# Patient Record
Sex: Male | Born: 1975 | Race: Black or African American | Hispanic: No | Marital: Married | Smoking: Current every day smoker
Health system: Southern US, Community
[De-identification: ages and names within clinical notes are randomized; demographics above are authoritative.]

## PROBLEM LIST (undated history)

## (undated) DIAGNOSIS — N200 Calculus of kidney: Secondary | ICD-10-CM

---

## 2003-04-26 ENCOUNTER — Emergency Department (HOSPITAL_COMMUNITY): Admission: EM | Admit: 2003-04-26 | Discharge: 2003-04-26 | Payer: Self-pay | Admitting: Emergency Medicine

## 2013-08-22 ENCOUNTER — Emergency Department (HOSPITAL_COMMUNITY)
Admission: EM | Admit: 2013-08-22 | Discharge: 2013-08-22 | Disposition: A | Discharge status: Home / Self Care | Payer: Self-pay | Attending: Emergency Medicine | Admitting: Emergency Medicine

## 2013-08-22 ENCOUNTER — Emergency Department (HOSPITAL_COMMUNITY): Payer: Self-pay

## 2013-08-22 ENCOUNTER — Encounter (HOSPITAL_COMMUNITY): Payer: Self-pay | Admitting: Emergency Medicine

## 2013-08-22 DIAGNOSIS — N23 Unspecified renal colic: Secondary | ICD-10-CM

## 2013-08-22 DIAGNOSIS — F17200 Nicotine dependence, unspecified, uncomplicated: Secondary | ICD-10-CM | POA: Insufficient documentation

## 2013-08-22 DIAGNOSIS — N201 Calculus of ureter: Secondary | ICD-10-CM | POA: Insufficient documentation

## 2013-08-22 DIAGNOSIS — R110 Nausea: Secondary | ICD-10-CM | POA: Insufficient documentation

## 2013-08-22 LAB — CBC WITH DIFFERENTIAL/PLATELET
Basophils Absolute: 0 10*3/uL (ref 0.0–0.1)
Basophils Relative: 0 % (ref 0–1)
Eosinophils Absolute: 0.1 10*3/uL (ref 0.0–0.7)
Eosinophils Relative: 1 % (ref 0–5)
HCT: 41.8 % (ref 39.0–52.0)
Hemoglobin: 15.4 g/dL (ref 13.0–17.0)
Lymphocytes Relative: 28 % (ref 12–46)
Lymphs Abs: 3.9 10*3/uL (ref 0.7–4.0)
MCH: 32.4 pg (ref 26.0–34.0)
MCHC: 36.8 g/dL — ABNORMAL HIGH (ref 30.0–36.0)
MCV: 88 fL (ref 78.0–100.0)
Monocytes Absolute: 1 10*3/uL (ref 0.1–1.0)
Monocytes Relative: 7 % (ref 3–12)
Neutro Abs: 8.9 10*3/uL — ABNORMAL HIGH (ref 1.7–7.7)
Neutrophils Relative %: 64 % (ref 43–77)
Platelets: 246 10*3/uL (ref 150–400)
RBC: 4.75 MIL/uL (ref 4.22–5.81)
RDW: 12.1 % (ref 11.5–15.5)
WBC: 13.9 10*3/uL — ABNORMAL HIGH (ref 4.0–10.5)

## 2013-08-22 LAB — BASIC METABOLIC PANEL
BUN: 14 mg/dL (ref 6–23)
CO2: 22 mEq/L (ref 19–32)
Calcium: 9.5 mg/dL (ref 8.4–10.5)
Chloride: 101 mEq/L (ref 96–112)
Creatinine, Ser: 1.26 mg/dL (ref 0.50–1.35)
GFR calc Af Amer: 82 mL/min — ABNORMAL LOW (ref >90)
GFR calc non Af Amer: 70 mL/min — ABNORMAL LOW (ref >90)
Glucose, Bld: 167 mg/dL — ABNORMAL HIGH (ref 70–99)
Potassium: 3.5 mEq/L (ref 3.5–5.1)
Sodium: 136 mEq/L (ref 135–145)

## 2013-08-22 MED ORDER — IBUPROFEN 600 MG PO TABS: 600.0000 mg | ORAL_TABLET | Freq: Four times a day (QID) | ORAL | Status: DC | PRN

## 2013-08-22 MED ORDER — ONDANSETRON HCL 4 MG/2ML IJ SOLN
4.0000 mg | Freq: Once | INTRAMUSCULAR | Status: AC
  Administered 2013-08-22: 4 mg via INTRAVENOUS
  Filled 2013-08-22: qty 2

## 2013-08-22 MED ORDER — SODIUM CHLORIDE 0.9 % IV BOLUS (SEPSIS)
1000.0000 mL | Freq: Once | INTRAVENOUS | Status: AC
  Administered 2013-08-22: 1000 mL via INTRAVENOUS

## 2013-08-22 MED ORDER — OXYCODONE-ACETAMINOPHEN 5-325 MG PO TABS: 1.0000 | ORAL_TABLET | ORAL | Status: DC | PRN

## 2013-08-22 MED ORDER — ONDANSETRON HCL 4 MG PO TABS: 4.0000 mg | ORAL_TABLET | Freq: Four times a day (QID) | ORAL | Status: DC

## 2013-08-22 MED ORDER — HYDROMORPHONE HCL PF 1 MG/ML IJ SOLN
1.0000 mg | Freq: Once | INTRAMUSCULAR | Status: AC
  Administered 2013-08-22: 1 mg via INTRAVENOUS
  Filled 2013-08-22: qty 1

## 2013-08-22 MED ORDER — KETOROLAC TROMETHAMINE 30 MG/ML IJ SOLN
30.0000 mg | Freq: Once | INTRAMUSCULAR | Status: AC
  Administered 2013-08-22: 30 mg via INTRAVENOUS
  Filled 2013-08-22: qty 1

## 2013-08-22 NOTE — ED Notes (Signed)
Pt c/o RLQ pain sudden onset tonight @ 2330, pain radiating from R testicle to lower abdomen. + nausea

## 2013-08-22 NOTE — ED Notes (Signed)
Pt sts that he is not void at this time

## 2013-08-27 NOTE — ED Provider Notes (Signed)
CSN: [id number]     Arrival date & time 08/22/13  0033 History   First MD Initiated Contact with Patient 08/22/13 0043     Chief Complaint  Patient presents with  . Abdominal Pain   (Consider location/radiation/quality/duration/timing/severity/associated sxs/prior Treatment) HPI  37 year old male with abdominal pain. Right lower quadrant to right flank. Acute onset around 11:30 PM tonight. Radiates down to his right testicle. Denies trauma. Feels nauseated, but no vomiting. No urinary complaints. No fevers or chills. No history similar complaints. No diarrhea. No intervention prior to arrival. No past history of similar symptoms. History reviewed. No pertinent past medical history. History reviewed. No pertinent past surgical history. No family history on file. History  Substance Use Topics  . Smoking status: Current Every Day Smoker  . Smokeless tobacco: Not on file  . Alcohol Use: No    Review of Systems  All systems reviewed and negative, other than as noted in HPI.   Allergies  Review of patient's allergies indicates no known allergies.  Home Medications   Current Outpatient Rx  Name  Route  Sig  Dispense  Refill  . ibuprofen (ADVIL,MOTRIN) 600 MG tablet   Oral   Take 1 tablet (600 mg total) by mouth every 6 (six) hours as needed for pain.   30 tablet   0   . ondansetron (ZOFRAN) 4 MG tablet   Oral   Take 1 tablet (4 mg total) by mouth every 6 (six) hours.   12 tablet   0   . oxyCODONE-acetaminophen (PERCOCET/ROXICET) 5-325 MG per tablet   Oral   Take 1-2 tablets by mouth every 4 (four) hours as needed for pain.   20 tablet   0    BP 140/66  Pulse 65  Temp(Src) 97.7 F (36.5 C) (Oral)  Resp 14  Ht 5\' 11"  (1.803 m)  Wt 205 lb (92.987 kg)  BMI 28.6 kg/m2  SpO2 98% Physical Exam  Nursing note and vitals reviewed. Constitutional: He appears well-developed and well-nourished. No distress.  HENT:  Head: Normocephalic and atraumatic.  Eyes: Conjunctivae  are normal. Right eye exhibits no discharge. Left eye exhibits no discharge.  Neck: Neck supple.  Cardiovascular: Normal rate, regular rhythm and normal heart sounds.  Exam reveals no gallop and no friction rub.   No murmur heard. Pulmonary/Chest: Effort normal and breath sounds normal. No respiratory distress.  Abdominal: Soft. He exhibits no distension. There is no tenderness.  Genitourinary:  No CVA tenderness. Normal external male genitalia. No swelling. No tenderness. No guarding appreciated.  Musculoskeletal: He exhibits no edema and no tenderness.  Neurological: He is alert.  Skin: Skin is warm and dry.  Psychiatric: He has a normal mood and affect. His behavior is normal. Thought content normal.    ED Course  Procedures (including critical care time) Labs Review Labs Reviewed  CBC WITH DIFFERENTIAL - Abnormal; Notable for the following:    WBC 13.9 (*)    MCHC 36.8 (*)    Neutro Abs 8.9 (*)    All other components within normal limits  BASIC METABOLIC PANEL - Abnormal; Notable for the following:    Glucose, Bld 167 (*)    GFR calc non Af Amer 70 (*)    GFR calc Af Amer 82 (*)    All other components within normal limits   Imaging Review No results found.  Ct Abdomen Pelvis Wo Contrast  08/22/2013   CLINICAL DATA:  Right flank pain.  EXAM: CT ABDOMEN AND PELVIS WITHOUT  CONTRAST  TECHNIQUE: Multidetector CT imaging of the abdomen and pelvis was performed following the standard protocol without intravenous contrast.  COMPARISON:  None.  FINDINGS: BODY WALL: Unremarkable.  LOWER CHEST:  Mediastinum: Unremarkable.  Lungs/pleura: No consolidation.  ABDOMEN/PELVIS:  Liver: No focal abnormality.  Biliary: No evidence of biliary obstruction or stone.  Pancreas: Unremarkable.  Spleen: Unremarkable.  Adrenals: Unremarkable.  Kidneys and ureters: Mild right hydroureteronephrosis secondary to a distal right ureteral calculus measuring 5 mm. There are additional right renal calculi,  measuring up to 4 mm. No left-sided hydronephrosis or urolithiasis.  Bladder: Unremarkable.  Bowel: No obstruction. Normal appendix.  Retroperitoneum: No mass or adenopathy.  Peritoneum: No free fluid or gas.  Reproductive: Unremarkable.  Vascular: No acute abnormality.  OSSEOUS: No acute abnormalities. No suspicious lytic or blastic lesions.  IMPRESSION: 1. 5 mm distal right ureteral calculus with proximal obstruction. 2. Right nephrolithiasis, up to 4 mm diameter.   Electronically Signed   By: Tiburcio Pea   On: 08/22/2013 01:44   MDM   1. Ureteral colic   2. Right ureteral stone    37 year old male with right-sided ureteral stone. Renal function is fine. Symptoms are controlled. Afebrile. No urinary complaints. Acute onset of symptoms not consistent with infected stone. Plan Sinemet treatment at this time. Urology followup provided if needed. Return precautions discussed.    Raeford Razor, MD 08/27/13 [phone]

## 2014-09-16 ENCOUNTER — Encounter (HOSPITAL_COMMUNITY): Payer: Self-pay | Admitting: Emergency Medicine

## 2014-09-16 DIAGNOSIS — L509 Urticaria, unspecified: Secondary | ICD-10-CM | POA: Insufficient documentation

## 2014-09-16 DIAGNOSIS — Z720 Tobacco use: Secondary | ICD-10-CM | POA: Insufficient documentation

## 2014-09-16 NOTE — ED Notes (Signed)
Pt reports hives all over his body and face which started today.  Pt reports taking benadryl and using hydrocortisone without relief.  Denies using new meds, detergent or soap.

## 2014-09-17 ENCOUNTER — Emergency Department (HOSPITAL_COMMUNITY)
Admission: EM | Admit: 2014-09-17 | Discharge: 2014-09-17 | Disposition: A | Discharge status: Home / Self Care | Payer: Self-pay | Attending: Emergency Medicine | Admitting: Emergency Medicine

## 2014-09-17 DIAGNOSIS — L509 Urticaria, unspecified: Secondary | ICD-10-CM

## 2014-09-17 MED ORDER — DIPHENHYDRAMINE HCL 25 MG PO CAPS
50.0000 mg | ORAL_CAPSULE | Freq: Once | ORAL | Status: AC
  Administered 2014-09-17: 50 mg via ORAL
  Filled 2014-09-17: qty 2

## 2014-09-17 MED ORDER — FAMOTIDINE 20 MG PO TABS
20.0000 mg | ORAL_TABLET | Freq: Once | ORAL | Status: AC
  Administered 2014-09-17: 20 mg via ORAL
  Filled 2014-09-17: qty 1

## 2014-09-17 MED ORDER — METHYLPREDNISOLONE SODIUM SUCC 125 MG IJ SOLR
125.0000 mg | Freq: Once | INTRAMUSCULAR | Status: AC
  Administered 2014-09-17: 125 mg via INTRAMUSCULAR
  Filled 2014-09-17: qty 2

## 2014-09-17 MED ORDER — HYDROXYZINE HCL 25 MG PO TABS: 25.0000 mg | ORAL_TABLET | Freq: Four times a day (QID) | ORAL | Status: AC | PRN

## 2014-09-17 MED ORDER — PREDNISONE 20 MG PO TABS: 40.0000 mg | ORAL_TABLET | Freq: Every day | ORAL | Status: AC

## 2014-09-17 NOTE — ED Provider Notes (Signed)
CSN: [id number]     Arrival date & time 09/16/14  2311 History   First MD Initiated Contact with Patient 09/17/14 0048     Chief Complaint  Patient presents with  . Rash     (Consider location/radiation/quality/duration/timing/severity/associated sxs/prior Treatment) Patient is a 38 y.o. male presenting with rash. The history is provided by the patient and the spouse.  Rash  This is a 38 year old male with no significant past medical history presenting to the ED for generalized urticarial rash. Patient states he began this morning with a burning sensation on his bilateral arms, soon afterwards he developed diffuse urticarial lesions across his arms, legs, and trunk. He denies any oral lesions, trouble swallowing, or sensation of shortness of breath. He denies any changes in soaps, detergents, or personal care products. Patient has no known food or drug allergies. No new medications. Patient has taken Benadryl and use hydrocortisone ointment without relief.  No insect bites or plant exposures. Vital signs stable on arrival.  History reviewed. No pertinent past medical history. History reviewed. No pertinent past surgical history. No family history on file. History  Substance Use Topics  . Smoking status: Current Every Day Smoker  . Smokeless tobacco: Not on file  . Alcohol Use: Yes    Review of Systems  Skin: Positive for rash.  All other systems reviewed and are negative.     Allergies  Review of patient's allergies indicates no known allergies.  Home Medications   Prior to Admission medications   Not on File   BP 130/76  Pulse 105  Temp(Src) 98.3 F (36.8 C) (Oral)  Resp 16  SpO2 98%  Physical Exam  Nursing note and vitals reviewed. Constitutional: He is oriented to person, place, and time. He appears well-developed and well-nourished.  HENT:  Head: Normocephalic and atraumatic.  Mouth/Throat: Oropharynx is clear and moist.  No oral lesions, airway patent,  handling secretions well  Eyes: Conjunctivae and EOM are normal. Pupils are equal, round, and reactive to light.  Neck: Normal range of motion.  Cardiovascular: Normal rate, regular rhythm and normal heart sounds.   Pulmonary/Chest: Effort normal and breath sounds normal.  Abdominal: Soft. Bowel sounds are normal.  Musculoskeletal: Normal range of motion.  Neurological: He is alert and oriented to person, place, and time.  Skin: Skin is warm and dry. Rash noted. Rash is urticarial.  Diffuse urticarial rash scattered across bilateral upper extremities, chest, back, and bilateral lower extremities; no drainage or signs of superimposed infection; no cellulitis; no vesicles or pustules; no lesions on palms or soles  Psychiatric: He has a normal mood and affect.    ED Course  Procedures (including critical care time) Labs Review Labs Reviewed - No data to display  Imaging Review No results found.   EKG Interpretation None      MDM   Final diagnoses:  Urticarial rash   38 year old male with generalized urticarial rash without known source.  On exam, patient afebrile nontoxic-appearing. His respirations are unlabored, airway is patent, handling secretions well. No oral lesions.  VS stable on RA.  Patient given dose of Solu-Medrol, Benadryl, and Pepcid with significant improvement of his symptoms. Hives still present, but has diminished somewhat since arrival.  VS remain stable and airway remains clear.  Patient will be discharged home with steroid taper, will continue benadryl +/- pepcid or atarax for itching.  Discussed plan with patient, he/she acknowledged understanding and agreed with plan of care.  Return precautions given for new or worsening  symptoms including shortness of breath, difficulty swallowing, high fever, etc.  Garlon HatchetLisa M Leota Maka, PA-C 09/17/14 [phone]

## 2014-09-17 NOTE — Discharge Instructions (Signed)
Take the prescribed medication as directed.  Continue Benadryl and/or Pepcid as needed for itching. If still itching, may try atarax. Return to the ED for new or worsening symptoms-- swelling, difficulty swallowing, shortness of breath.

## 2014-09-17 NOTE — ED Provider Notes (Signed)
Medical screening examination/treatment/procedure(s) were performed by non-physician practitioner and as supervising physician I was immediately available for consultation/collaboration.   EKG Interpretation None       Ronald Marsland M Henrietta Cieslewicz, MD 09/17/14 0719 

## 2014-11-17 IMAGING — CT CT ABD-PELV W/O CM
1 series · 12 of 14 positions shown, 18 images · non-contrast
Comparison: None.

CLINICAL DATA: Right flank pain.

EXAM:
CT ABDOMEN AND PELVIS WITHOUT CONTRAST
TECHNIQUE: Multidetector CT imaging of the abdomen and pelvis was performed
following the standard protocol without intravenous contrast.

[Series 3: lung · axial · 0.65mm/px · z∈[+1548,+1603]mm · 12 of 14 slices shown, 18 images]
[im 2/14  soft-tissue]
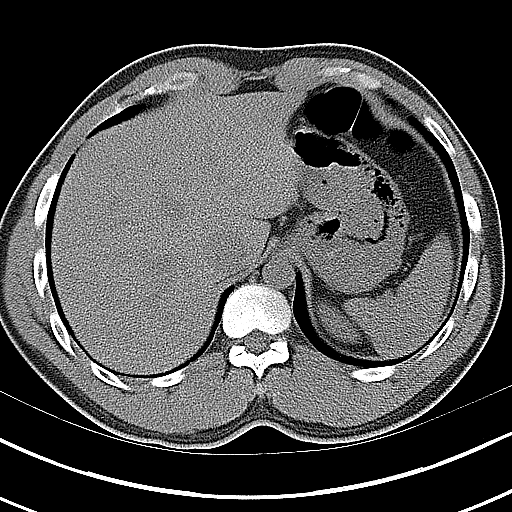
[im 2/14  bone]
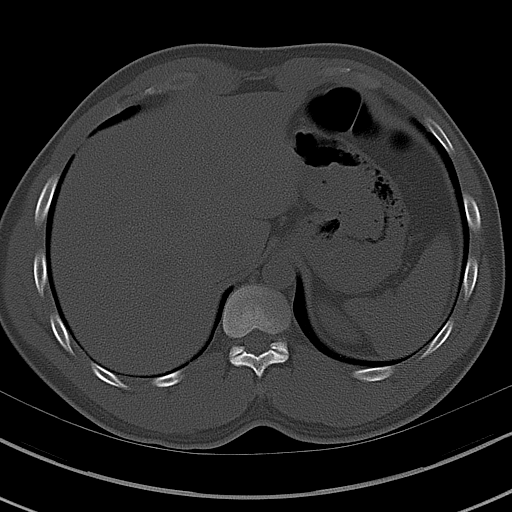
[im 3/14  soft-tissue]
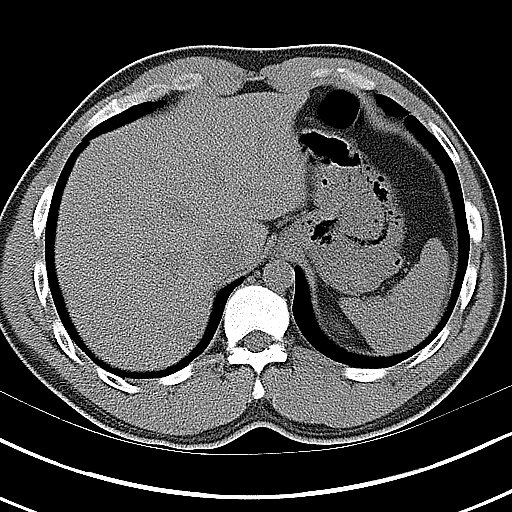
[im 4/14  soft-tissue]
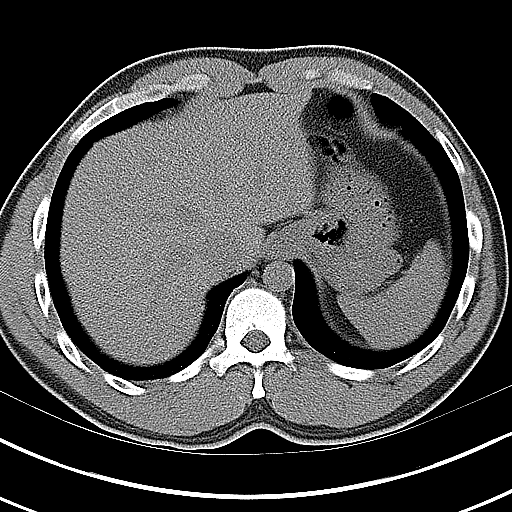
[im 5/14  soft-tissue]
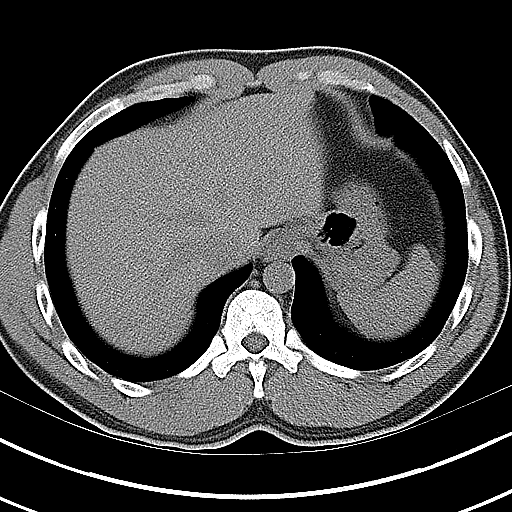
[im 6/14  soft-tissue]
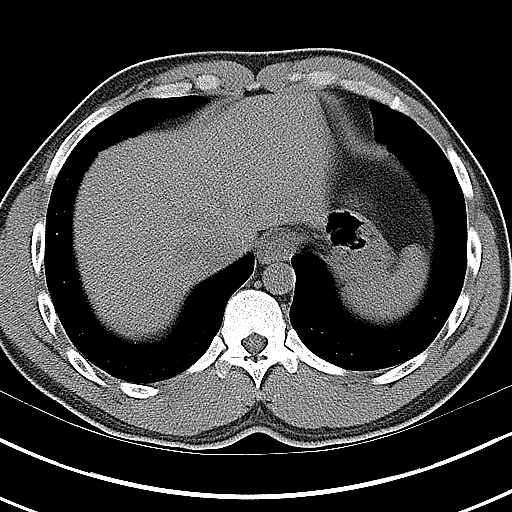
[im 7/14  soft-tissue]
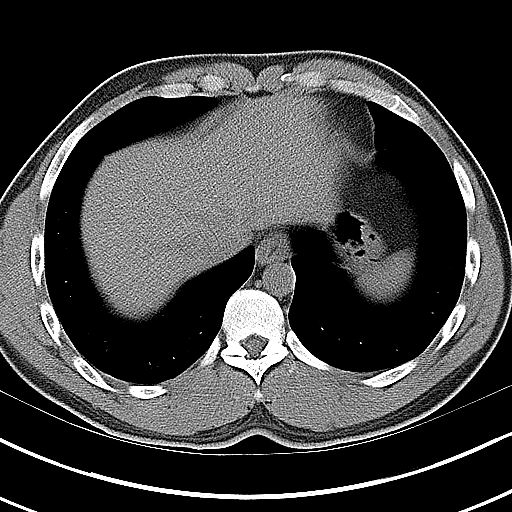
[im 8/14  soft-tissue]
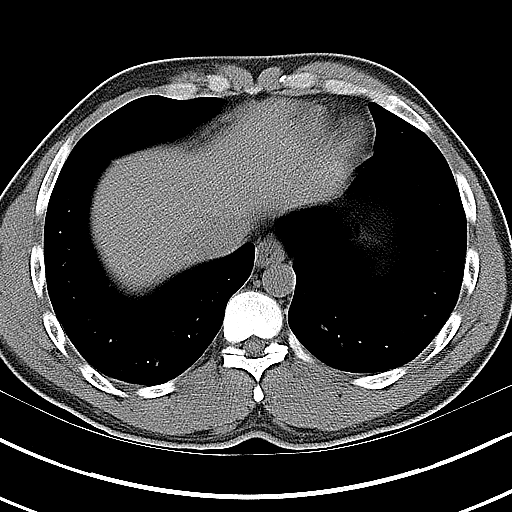
[im 9/14  soft-tissue]
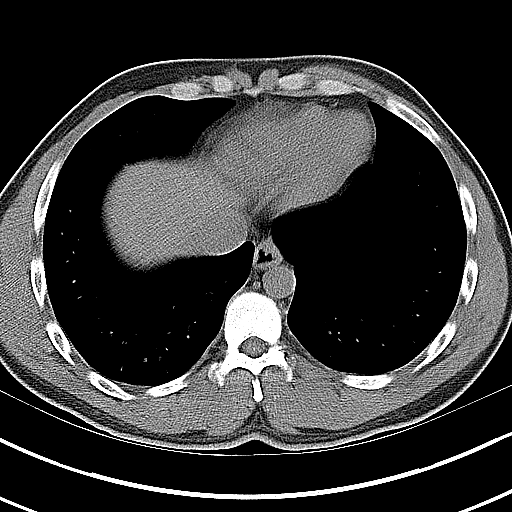
[im 10/14  soft-tissue]
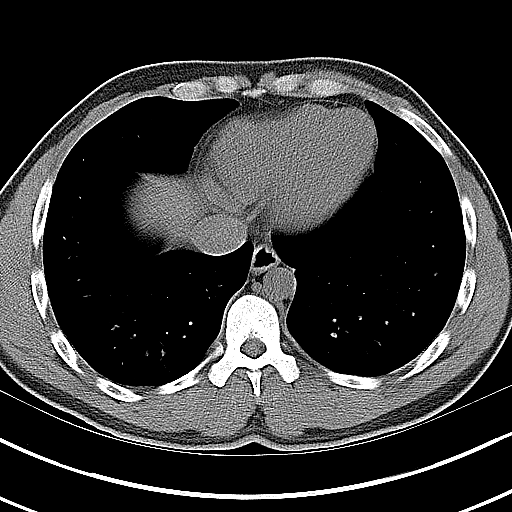
[im 10/14  lung]
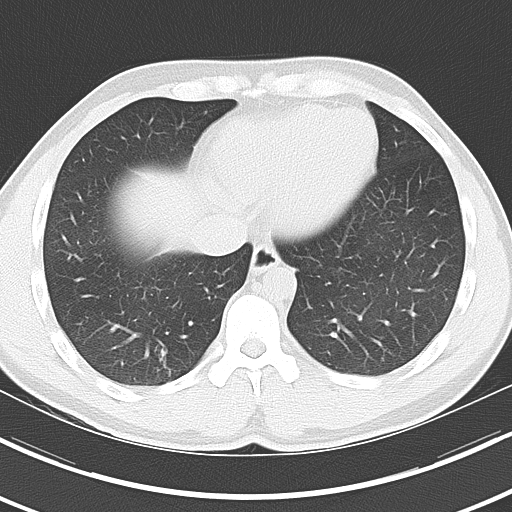
[im 10/14  bone]
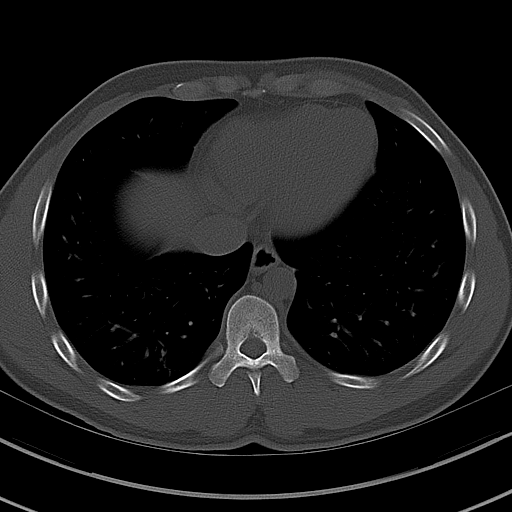
[im 11/14  soft-tissue]
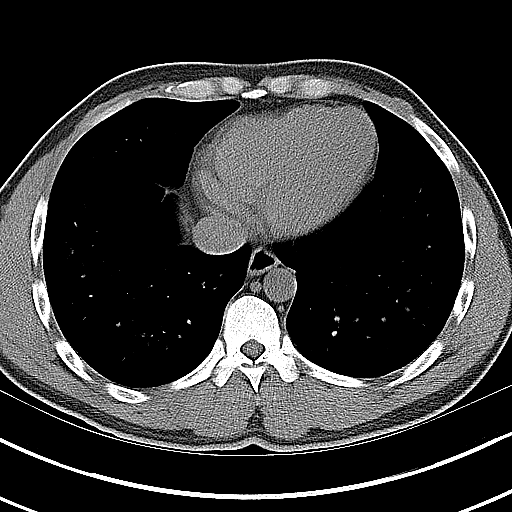
[im 11/14  lung]
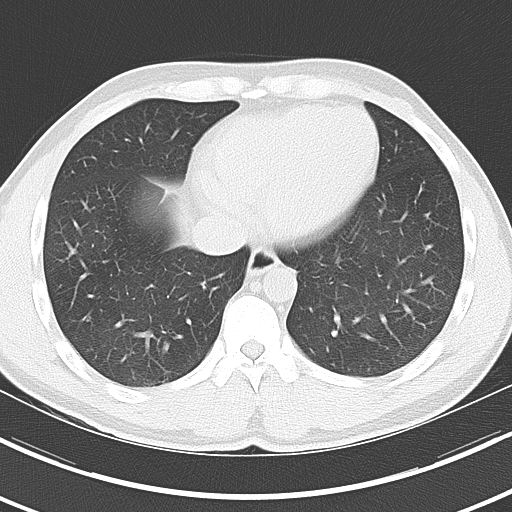
[im 12/14  soft-tissue]
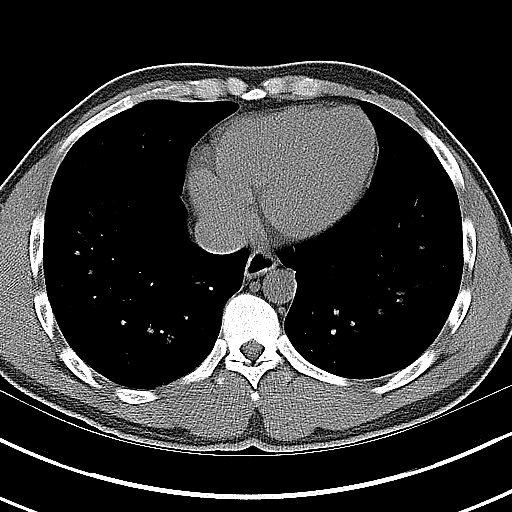
[im 12/14  lung]
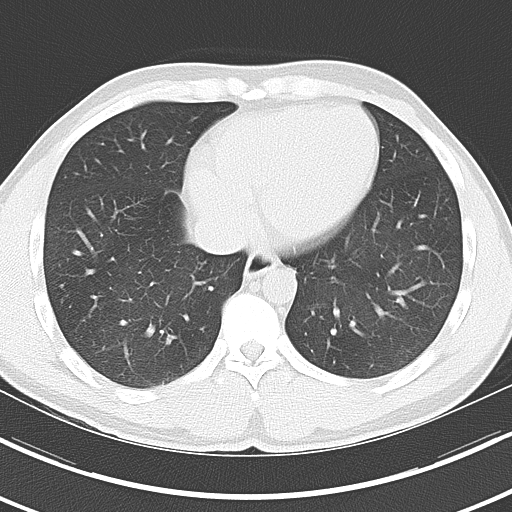
[im 13/14  soft-tissue]
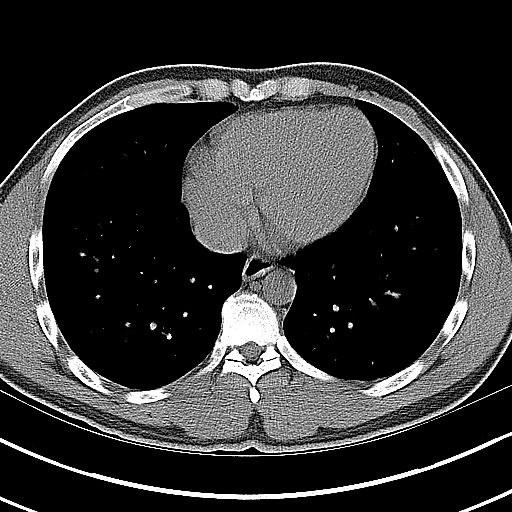
[im 13/14  lung]
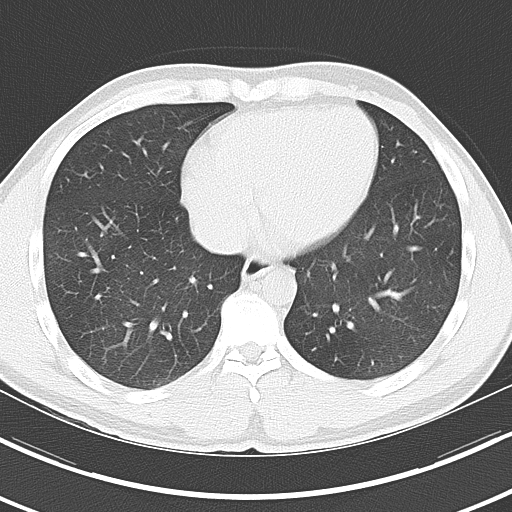

[12 of 14 positions shown; findings below may reference images not displayed]

FINDINGS: BODY WALL: Unremarkable.

LOWER CHEST:

Mediastinum: Unremarkable.

Lungs/pleura: No consolidation.

ABDOMEN/PELVIS:

Liver: No focal abnormality.

Biliary: No evidence of biliary obstruction or stone.

Pancreas: Unremarkable.

Spleen: Unremarkable.

Adrenals: Unremarkable.

Kidneys and ureters: Mild right hydroureteronephrosis secondary to a
distal right ureteral calculus measuring 5 mm. There are additional
right renal calculi, measuring up to 4 mm. No left-sided
hydronephrosis or urolithiasis.

Bladder: Unremarkable.

Bowel: No obstruction. Normal appendix.

Retroperitoneum: No mass or adenopathy.

Peritoneum: No free fluid or gas.

Reproductive: Unremarkable.

Vascular: No acute abnormality.

OSSEOUS: No acute abnormalities. No suspicious lytic or blastic
lesions.
IMPRESSION: 1. 5 mm distal right ureteral calculus with proximal obstruction.
2. Right nephrolithiasis, up to 4 mm diameter.

## 2015-07-16 ENCOUNTER — Encounter (HOSPITAL_COMMUNITY): Payer: Self-pay

## 2015-07-16 ENCOUNTER — Emergency Department (HOSPITAL_COMMUNITY)
Admission: EM | Admit: 2015-07-16 | Discharge: 2015-07-16 | Disposition: A | Discharge status: Home / Self Care | Payer: Self-pay | Attending: Emergency Medicine | Admitting: Emergency Medicine

## 2015-07-16 DIAGNOSIS — Z87442 Personal history of urinary calculi: Secondary | ICD-10-CM | POA: Insufficient documentation

## 2015-07-16 DIAGNOSIS — Z720 Tobacco use: Secondary | ICD-10-CM | POA: Insufficient documentation

## 2015-07-16 DIAGNOSIS — L02811 Cutaneous abscess of head [any part, except face]: Secondary | ICD-10-CM | POA: Insufficient documentation

## 2015-07-16 DIAGNOSIS — H6091 Unspecified otitis externa, right ear: Secondary | ICD-10-CM | POA: Insufficient documentation

## 2015-07-16 HISTORY — DX: Calculus of kidney: N20.0

## 2015-07-16 MED ORDER — SULFAMETHOXAZOLE-TRIMETHOPRIM 800-160 MG PO TABS
1.0000 | ORAL_TABLET | Freq: Once | ORAL | Status: AC
  Administered 2015-07-16: 1 via ORAL
  Filled 2015-07-16: qty 1

## 2015-07-16 MED ORDER — NEOMYCIN-COLIST-HC-THONZONIUM 3.3-3-10-0.5 MG/ML OT SUSP
3.0000 [drp] | Freq: Once | OTIC | Status: AC
  Administered 2015-07-16: 3 [drp] via OTIC
  Filled 2015-07-16: qty 5

## 2015-07-16 MED ORDER — SULFAMETHOXAZOLE-TRIMETHOPRIM 800-160 MG PO TABS: 1.0000 | ORAL_TABLET | Freq: Two times a day (BID) | ORAL | Status: AC

## 2015-07-16 NOTE — Discharge Instructions (Signed)
Abscess Return for fever, increased swelling or no improvement in 48 hours. Apply warm compresses to the scalp several times a day.  An abscess (boil or furuncle) is an infected area on or under the skin. This area is filled with yellowish-white fluid (pus) and other material (debris). HOME CARE   Only take medicines as told by your doctor.  If you were given antibiotic medicine, take it as directed. Finish the medicine even if you start to feel better.  If gauze is used, follow your doctor's directions for changing the gauze.  To avoid spreading the infection:  Keep your abscess covered with a bandage.  Wash your hands well.  Do not share personal care items, towels, or whirlpools with others.  Avoid skin contact with others.  Keep your skin and clothes clean around the abscess.  Keep all doctor visits as told. GET HELP RIGHT AWAY IF:   You have more pain, puffiness (swelling), or redness in the wound site.  You have more fluid or blood coming from the wound site.  You have muscle aches, chills, or you feel sick.  You have a fever. MAKE SURE YOU:   Understand these instructions.  Will watch your condition.  Will get help right away if you are not doing well or get worse. Document Released: 05/02/2008 Document Revised: 05/15/2012 Document Reviewed: 01/27/2012 [facility] Patient Information 2015 [place], [place]. This information is not intended to replace advice given to you by your health care provider. Make sure you discuss any questions you have with your health care provider.  Otitis Externa Otitis externa is a bacterial or fungal infection of the outer ear canal. This is the area from the eardrum to the outside of the ear. Otitis externa is sometimes called "swimmer's ear." CAUSES  Possible causes of infection include:  Swimming in dirty water.  Moisture remaining in the ear after swimming or bathing.  Mild injury (trauma) to the ear.  Objects stuck in the ear  (foreign body).  Cuts or scrapes (abrasions) on the outside of the ear. SIGNS AND SYMPTOMS  The first symptom of infection is often itching in the ear canal. Later signs and symptoms may include swelling and redness of the ear canal, ear pain, and yellowish-white fluid (pus) coming from the ear. The ear pain may be worse when pulling on the earlobe. DIAGNOSIS  Your health care provider will perform a physical exam. A sample of fluid may be taken from the ear and examined for bacteria or fungi. TREATMENT  Antibiotic ear drops are often given for 10 to 14 days. Treatment may also include pain medicine or corticosteroids to reduce itching and swelling. HOME CARE INSTRUCTIONS   Apply antibiotic ear drops to the ear canal as prescribed by your health care provider.  Take medicines only as directed by your health care provider.  If you have diabetes, follow any additional treatment instructions from your health care provider.  Keep all follow-up visits as directed by your health care provider. PREVENTION   Keep your ear dry. Use the corner of a towel to absorb water out of the ear canal after swimming or bathing.  Avoid scratching or putting objects inside your ear. This can damage the ear canal or remove the protective wax that lines the canal. This makes it easier for bacteria and fungi to grow.  Avoid swimming in lakes, polluted water, or poorly chlorinated pools.  You may use ear drops made of rubbing alcohol and vinegar after swimming. Combine equal parts  of white vinegar and alcohol in a bottle. Put 3 or 4 drops into each ear after swimming. SEEK MEDICAL CARE IF:   You have a fever.  Your ear is still red, swollen, painful, or draining pus after 3 days.  Your redness, swelling, or pain gets worse.  You have a severe headache.  You have redness, swelling, pain, or tenderness in the area behind your ear. MAKE SURE YOU:   Understand these instructions.  Will watch your  condition.  Will get help right away if you are not doing well or get worse. Document Released: 11/14/2005 Document Revised: 03/31/2014 Document Reviewed: 12/01/2011 [facility] Patient Information 2015 [place], [place]. This information is not intended to replace advice given to you by your health care provider. Make sure you discuss any questions you have with your health care provider.  Emergency Department Resource Guide 1) Find a Doctor and Pay Out of Pocket Although you won't have to find out who is covered by your insurance plan, it is a good idea to ask around and get recommendations. You will then need to call the office and see if the doctor you have chosen will accept you as a new patient and what types of options they offer for patients who are self-pay. Some doctors offer discounts or will set up payment plans for their patients who do not have insurance, but you will need to ask so you aren't surprised when you get to your appointment.  2) Contact Your Local Health Department Not all health departments have doctors that can see patients for sick visits, but many do, so it is worth a call to see if yours does. If you don't know where your local health department is, you can check in your phone book. The CDC also has a tool to help you locate your state's health department, and many state websites also have listings of all of their local health departments.  3) Find a Walk-in Clinic If your illness is not likely to be very severe or complicated, you may want to try a walk in clinic. These are popping up all over the country in pharmacies, drugstores, and shopping centers. They're usually staffed by nurse practitioners or physician assistants that have been trained to treat common illnesses and complaints. They're usually fairly quick and inexpensive. However, if you have serious medical issues or chronic medical problems, these are probably not your best option.  No Primary Care  Doctor: - Call Health Connect at  [phone] - they can help you locate a primary care doctor that  accepts your insurance, provides certain services, etc. - Physician Referral Service- [phone]  Chronic Pain Problems: Organization         Address  Phone   Notes  [facility]  [phone] Patients need to be referred by their primary care doctor.   Medication Assistance: Organization         Address  Phone   Notes  [facility] [address] [place] [place], [place] [address] [phone] --Must be a resident of [facility] -- Must have NO insurance coverage whatsoever (no Medicaid/ Medicare, etc.) -- The pt. MUST have a primary care doctor that directs their care regularly and follows them in the community   [facility]ist  [phone]   [employer]  [phone]    Agencies that provide inexpensive medical care: Organization         Address  Phone   Notes  [facility]  [phone]   [facility]    [phone]   [facility] [address] [place], [place] [address] [phone]   [facility] [address], [place] [phone]   [facility]anned Parenthood    [phone]   [facility]    [phone]   [facility] and [facility]  [address], [place] Phone:  [phone], Fax:  [phone] Hours of Operation:  9 am - 6 pm, M-F.  Also accepts Medicaid/Medicare and self-pay.  [facility]  [address], [place] Phone: [phone]) [phone], Fax: [phone]. Hours of Operation:  8:30 am - 5:30 pm, M-F.  Also accepts Medicaid and self-pay.  [facility] [place] [address], [place] Phone: [phone]) [phone]   [facility] [address], [place], [place] [phone], Ext. [phone] Mondays & Thursdays: 7-9 AM.  First 15 patients are seen on a first  come, first serve basis.    Medicaid-accepting [facility] Providers:  Organization         Address  Phone   Notes  [facility] [address], [place] [phone] Also accepts self-pay patients.  [facility] [address] [place], [place]  [phone]   [facility] [address], [place] [phone]) [phone]   [facility] [address] [place] [address], [place] [phone]   Renaye RakersVeita Bland [address], [place]   [phone] Only accepts [place] Access [place] patients after they have their name applied to their card.   Self-Pay (no insurance) in [facility]:  Organization         Address  Phone   Notes  Sickle Cell Patients, [facility] [address], [place] [phone]   [facility] [address], [place] [phone]   [facility] [place]  [address], [place] [phone]   [facility]/Dr. Osei-Bonsu  [address] [place] [address], [place] or [phone], [place] [phone] Phone number for both [place] and [place] locations is the same.  [facility] and [facility] [address], [place] [phone]   [facility] [address] [place] [address], [place] or [address] [phone]   [facility] [address], [place] [phone]) [phone], phone; [phone], fax Sees patients 1st and 3rd Saturday of every month.  Must not qualify for public or private insurance (i.e. Medicaid, Medicare, [place] Health Choice, Veterans' Benefits)  Household income should be no more than 200% of the poverty level The clinic cannot treat you if you are pregnant or think you are pregnant  Sexually transmitted diseases are not treated at the clinic.    Dental Care: Organization          Address  Phone  Notes  [facility] Department of [facility] [address], [place] [phone] Accepts children up to age 41 who are enrolled in [place] or [place] Health Choice; pregnant women with a Medicaid card; and children who have applied for Medicaid or [place] Health Choice, but were declined, whose parents can pay a reduced fee at time of service.  [facility] Department of  [facility]  [address], [place] [phone] Accepts children up to age 50 who are enrolled in Medicaid or [place] Health Choice; pregnant women with a Medicaid card; and children who have applied for Medicaid or [place] Health Choice, but were declined, whose parents can pay a reduced fee at time of service.  [facility]  [address] [place], [place] [phone] Patients are seen by appointment only. Walk-ins are not accepted. [facility] will see patients 78 years of age and older. Monday - Tuesday (8am-5pm) Most Wednesdays (8:30-5pm) $30 per visit, cash only  [facility] Adult Dental Access PROGRAM  [address], [facility] [phone] Patients are seen by appointment only. Walk-ins are not accepted. [facility] will see patients 58 years of age and older. One Wednesday Evening (Monthly: Volunteer Based).  $30 per visit, cash only  [employer]  [phone] for adults; Children under age 39, call Graduate Pediatric Dentistry at [phone]. Children aged 54-14, please call [phone] to request a pediatric application.  Dental services are provided in all areas of dental care including fillings, crowns and bridges, complete and partial dentures, implants, gum treatment, root canals, and extractions. Preventive care is also provided. Treatment is provided to both adults and children. Patients are selected via a lottery and there is often a waiting list.   [facility] [address] [place]  [phone] [email or web address]   [facility] [address] [place], [place] [phone], Ext. [phone] Second and Fourth Thursday of each month, opens at 6:30 AM; Clinic ends at 9 AM.  Patients are seen on a first-come first-served basis, and a limited number are seen during each clinic.   [facility]  [address] [place] [employer], [place] [phone]   Eligibility Requirements You must have lived in [place], [place], or [place] counties for at least the last three months.   You cannot be eligible for state or federal sponsored [place], including [employer], [place], or [employer].   You generally cannot be eligible for healthcare insurance through your employer.    How to apply: Eligibility screenings are held every Tuesday and Wednesday afternoon from 1:00 pm until 4:00 pm. You do not need an appointment for the interview!  [facility] [address], [place], [place] [phone]   [facility] Health Department  [phone]   [facility] Health Department  [phone]   [facility] Health Department  [phone]    Behavioral Health Resources in the Community: Intensive Outpatient Programs Organization         Address  Phone  Notes  [facility] [address] [place], [place] [phone]   [facility] Outpatient [address], [place], [place] [phone]   ADS: [facility] & Drug S[facility] [address], [place], [place]  [phone]   [facility] Health [address],  [place], [place] [phone] or [phone]   Substance Abuse Resources Organization         Address  Phone  Notes  Alcohol and Drug Services  [phone]   [facility]  [phone]   [facility] [place]  [phone]   Floydene Flock  [phone]   Residential & Outpatient Substance Abuse Program  [phone]   Psychological  Services Organization         Address  Phone  Notes  [facility] Behavioral Health  [phone]   [employer]   [facility] [address] [place] [phone] or [phone]    Mobile Crisis Teams Organization         Address  Phone  Notes  [facility], Mobile Crisis Care Unit  [phone]   [facility]  [address] [place], [place] [phone]   Doristine Locks [address] [place] [phone]    Self-Help/Support Groups Organization         Address  Phone             Notes  [facility] of [place] - variety of support groups  336- I7437963 Call for more information  Narcotics Anonymous (NA), [facility] [address], [employer] Liberty  2 meetings at this location   Statistician         Address  Phone  Notes  [facility] [address],    [place]  [phone]   [facility]  [address], [place] [address]33, [place], [place] [phone]   [facility] Treatment Facility [address] [place], [place] [phone] Admissions: 8am-3pm M-F  [facility] [address],    [place], [place] [phone]   [facility] [address] [place], [place], [place] [phone]   [facility] [address]  [place], [place] [phone]   [facility] - Intensive Outpatient [address], [place], [place] [phone]   [facility] (Addiction Recovery [facility] [address] [place]  [place], [place] [phone] or [phone]   [facility] ([facility]) [address], [place], [place] [phone] Accepts Medicaid  [facility] [address],  [place] [phone] Substance Abuse/Addiction Treatment   [facility] Organization         Address  Phone  Notes  [facility]  [phone]   Angie Fava, PhD [address] [place], [place]   [phone] or [phone]   [facility] Behavioral   [address] [place], [place] [phone]   [facility] [address], [place], [place] [phone] Insurance/Medicaid/sponsorship through [facility] and Families [address]                                    [place], [place] [phone] Therapy/tele-psych/case  [facility] [address], [place] [phone]    Dr. Lolly Mustache  [phone]   [facility]  [employer] [facility]. 1) [address], [place] 2) [address], [place] 3)  [address], [place] [phone]  [phone]   [facility] Child Abuse Hotline (863)233-4811 or [phone] (After Hours)

## 2015-07-16 NOTE — ED Provider Notes (Signed)
CSN: [id number]     Arrival date & time 07/16/15  1018 History   First MD Initiated Contact with Patient 07/16/15 1031     Chief Complaint  Patient presents with  . "Bumps on Scalp and Ears"      (Consider location/radiation/quality/duration/timing/severity/associated sxs/prior Treatment) The history is provided by the patient. No language interpreter was used.  Mr. Ronald Ray is  39 year old male with a history of kidney stones who presents for bumps on scalp and ears for the past 3-4 days. He states that the bumps are sore and itchy. He is also complaining of right ear pain. He denies any treatment prior to arrival. He denies shaving his head and had his last haircut 3 weeks ago at the barbershop. He denies putting anything in the ear or swimming. He denies any fever, chills.  Past Medical History  Diagnosis Date  . Kidney stones    History reviewed. No pertinent past surgical history. History reviewed. No pertinent family history. Social History  Substance Use Topics  . Smoking status: Current Every Day Smoker -- 1.00 packs/day    Types: Cigarettes  . Smokeless tobacco: None  . Alcohol Use: Yes    Review of Systems  Constitutional: Negative for fever and chills.  HENT: Positive for ear pain. Negative for ear discharge, facial swelling and sore throat.   Skin: Positive for color change.  All other systems reviewed and are negative.     Allergies  Review of patient's allergies indicates no known allergies.  Home Medications   Prior to Admission medications   Medication Sig Start Date End Date Taking? Authorizing Provider  hydrOXYzine (ATARAX/VISTARIL) 25 MG tablet Take 1 tablet (25 mg total) by mouth every 6 (six) hours as needed for itching. 09/17/14   Garlon Hatchet, PA-C  predniSONE (DELTASONE) 20 MG tablet Take 2 tablets (40 mg total) by mouth daily. Take 40 mg by mouth daily for 3 days, then 20mg  by mouth daily for 3 days, then 10mg  daily for 3 days 09/17/14   Garlon Hatchet, PA-C  sulfamethoxazole-trimethoprim (BACTRIM DS,SEPTRA DS) 800-160 MG per tablet Take 1 tablet by mouth 2 (two) times daily. 07/16/15 07/23/15  Jaretssi Kraker Patel-Mills, PA-C   BP 139/83 mmHg  Pulse 74  Temp(Src) 98.1 F (36.7 C) (Oral)  Resp 16  SpO2 100% Physical Exam  Constitutional: He is oriented to person, place, and time. He appears well-developed and well-nourished. No distress.  HENT:  Head: Normocephalic and atraumatic.  Right Ear: Tympanic membrane and external ear normal. No swelling.  Left Ear: Tympanic membrane and external ear normal.  Right TM is erythematous and edematous but without drainage.   Left TM and canal is normal.   Eyes: Conjunctivae are normal.  Neck: Neck supple.  Cardiovascular: Normal rate.   Pulmonary/Chest: Effort normal. No respiratory distress.  Musculoskeletal: Normal range of motion.  Neurological: He is alert and oriented to person, place, and time.  Skin: Skin is warm and dry.  3 areas along the scalp measuring approximately 3 x 3 cm that are erythematous and edematous. There is no fluctuance or drainage to the areas.  Psychiatric: He has a normal mood and affect. His behavior is normal.  Nursing note and vitals reviewed.   ED Course  Procedures (including critical care time) Labs Review Labs Reviewed - No data to display  Imaging Review No results found. I have personally reviewed and evaluated these images and lab results as part of my medical decision-making.   EKG Interpretation  None      MDM   Final diagnoses:  Right otitis externa  Abscess, scalp   Patient is well-appearing and in no acute distress. His vitals are stable.It appears that he has a right otitis externa. He also has 3 small abscesses on the scalp. Abscess are not drainable at this time. I will treat him with Bactrim and I have given him Cortisporin otic suspension. I discussed return precautions and follow up and he verbally agrees with the plan. Medications   sulfamethoxazole-trimethoprim (BACTRIM DS,SEPTRA DS) 800-160 MG per tablet 1 tablet (1 tablet Oral Given 07/16/15 1113)  neomycin-colistin-hydrocortisone-thonzonium (CORTISPORIN TC) otic suspension 3 drop (3 drops Right Ear Given 07/16/15 1121)        Catha Gosselin, PA-C 07/16/15 1239  Lorre Nick, MD 07/17/15 2342

## 2015-07-16 NOTE — ED Notes (Signed)
Pt c/o increasing "bumps on scalp and ears" x 3-4 days.  Pt reports that they "bumps" are sore and itchy.  Pt sts "every morning I wake up w/ more bumps."

## 2015-12-16 ENCOUNTER — Emergency Department (HOSPITAL_COMMUNITY)
Admission: EM | Admit: 2015-12-16 | Discharge: 2015-12-16 | Disposition: A | Discharge status: Home / Self Care | Payer: Self-pay | Attending: Emergency Medicine | Admitting: Emergency Medicine

## 2015-12-16 ENCOUNTER — Encounter (HOSPITAL_COMMUNITY): Payer: Self-pay | Admitting: *Deleted

## 2015-12-16 DIAGNOSIS — R630 Anorexia: Secondary | ICD-10-CM | POA: Insufficient documentation

## 2015-12-16 DIAGNOSIS — H9203 Otalgia, bilateral: Secondary | ICD-10-CM | POA: Insufficient documentation

## 2015-12-16 DIAGNOSIS — R350 Frequency of micturition: Secondary | ICD-10-CM | POA: Insufficient documentation

## 2015-12-16 DIAGNOSIS — R21 Rash and other nonspecific skin eruption: Secondary | ICD-10-CM | POA: Insufficient documentation

## 2015-12-16 DIAGNOSIS — R220 Localized swelling, mass and lump, head: Secondary | ICD-10-CM | POA: Insufficient documentation

## 2015-12-16 DIAGNOSIS — R5383 Other fatigue: Secondary | ICD-10-CM | POA: Insufficient documentation

## 2015-12-16 DIAGNOSIS — F17210 Nicotine dependence, cigarettes, uncomplicated: Secondary | ICD-10-CM | POA: Insufficient documentation

## 2015-12-16 DIAGNOSIS — Z87442 Personal history of urinary calculi: Secondary | ICD-10-CM | POA: Insufficient documentation

## 2015-12-16 LAB — CBG MONITORING, ED: GLUCOSE-CAPILLARY: 67 mg/dL (ref 65–99)

## 2015-12-16 MED ORDER — CLOTRIMAZOLE 1 % EX CREA: TOPICAL_CREAM | CUTANEOUS | Status: AC

## 2015-12-16 MED ORDER — PROMETHAZINE HCL 25 MG PO TABS: 25.0000 mg | ORAL_TABLET | Freq: Four times a day (QID) | ORAL | Status: AC | PRN

## 2015-12-16 NOTE — ED Provider Notes (Signed)
History  By signing my name below, I, Ronald Ray, attest that this documentation has been prepared under the direction and in the presence of [employer], PA-C. Electronically Signed: Karle Ray, ED Scribe. 12/16/2015. 7:25 PM.  Chief Complaint  Patient presents with  . Otalgia  . Rash   The history is provided by the patient and medical records. No language interpreter was used.    HPI Comments:  Ronald Ray is a 40 y.o. male who presents to the Emergency Department complaining of bilateral ear swelling and pain that has been ongoing for the past 2 weeks. He reports associated itching and burning of the ears. He also reports an itchy rash to the anterior chest that also appeared about two weeks ago. He reports associated chills, decreased appetite, nausea, increased urination and fatigue. He has not taken anything for his symptoms. He denies any modifying factors. He denies nausea, vomiting, abdominal pain, rash to palms of hands or soles of feet. 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 He does not have a PCP.  Past Medical History  Diagnosis Date  . Kidney stones    History reviewed. No pertinent past surgical history. No family history on file. Social History  Substance Use Topics  . Smoking status: Current Every Day Smoker -- 1.00 packs/day    Types: Cigarettes  . Smokeless tobacco: None  . Alcohol Use: Yes    Review of Systems   10 systems reviewed and found to be negative, except as noted in the HPI.   Allergies  Review of patient's allergies indicates no known allergies.  Home Medications   Prior to Admission medications   Medication Sig Start Date End Date Taking? Authorizing Provider  hydrOXYzine (ATARAX/VISTARIL) 25 MG tablet Take 1 tablet (25 mg total) by mouth every 6 (six) hours as needed for itching. Patient not taking: Reported on 12/16/2015 09/17/14   Ronald Hatchet, PA-C  predniSONE  (DELTASONE) 20 MG tablet Take 2 tablets (40 mg total) by mouth daily. Take 40 mg by mouth daily for 3 days, then  by mouth daily for 3 days, then  daily for 3 days Patient not taking: Reported on 12/16/2015 09/17/14   Ronald Hatchet, PA-C   Triage Vitals: BP 138/85 mmHg  Pulse 75  Temp(Src) 97.3 F (36.3 C) (Oral)  Resp 18  SpO2 100% Physical Exam  Constitutional: He is oriented to person, place, and time. He appears well-developed and well-nourished. No distress.  HENT:  Head: Normocephalic.  Eyes: Conjunctivae and EOM are normal. Pupils are equal, round, and reactive to light.  Neck: Normal range of motion.  Cardiovascular: Normal rate, regular rhythm and intact distal pulses.   Pulmonary/Chest: Effort normal and breath sounds normal. No stridor. No respiratory distress. He has no wheezes. He has no rales. He exhibits no tenderness.  Abdominal: Soft. Bowel sounds are normal.  Musculoskeletal: Normal range of motion.  Neurological: He is alert and oriented to person, place, and time.  Skin: Rash noted.  Bilateral ears with dried skin very mild diffuse edema. Patient has scaled lesion on chest with central clearing and no warmth. Lesions spare the palms soles and mucous membranes.  Psychiatric: He has a normal mood and affect.  Nursing note and vitals reviewed.   ED Course  Procedures (including critical care time) DIAGNOSTIC STUDIES: Oxygen Saturation is 100% on RA, normal by my interpretation.   COORDINATION OF CARE: 6:57 PM- Will prescribe  Clotrimazole and Zofran. Return precautions discussed. Will provide work note. Will check CBG prior to discharge and give resource guide to establish care with a PCP. Pt verbalizes understanding and agrees to plan.  Medications - No data to display  Labs Review Labs Reviewed  CBG MONITORING, ED    MDM   Final diagnoses:  Rash and nonspecific skin eruption    Filed Vitals:   12/16/15 1716  BP: 138/85  Pulse: 75  Temp:  97.3 F (36.3 C)  TempSrc: Oral  Resp: 18  SpO2: 100%    Ronald Ray is 40 y.o. male presenting with pruritic rash consistent with several right dermatitis versus tinea corporis. No red flags. Patient states that he has fatigue and also polyuria and polydipsia, CBG within normal limits, advised him he will need to establish primary care for full diabetes evaluation.  Evaluation does not show pathology that would require ongoing emergent intervention or inpatient treatment. Pt is hemodynamically stable and mentating appropriately. Discussed findings and plan with patient/guardian, who agrees with care plan. All questions answered. Return precautions discussed and outpatient follow up given.   New Prescriptions   CLOTRIMAZOLE (LOTRIMIN) 1 % CREAM    Apply to affected area 2 times daily for at least 4 weeks ( for 1 week until after the lesions have healed)         Ronald Emery, PA-C 12/16/15 2037  Ronald Barrette, MD 12/16/15 [phone]

## 2015-12-16 NOTE — Discharge Instructions (Signed)
Do not hesitate to return to the emergency room for any new, worsening or concerning symptoms. ° °Please obtain primary care using resource guide below. Let them know that you were seen in the emergency room and that they will need to obtain records for further outpatient management. ° ° ° °Emergency Department Resource Guide °1) Find a Doctor and Pay Out of Pocket °Although you won't have to find out who is covered by your insurance plan, it is a good idea to ask around and get recommendations. You will then need to call the office and see if the doctor you have chosen will accept you as a new patient and what types of options they offer for patients who are self-pay. Some doctors offer discounts or will set up payment plans for their patients who do not have insurance, but you will need to ask so you aren't surprised when you get to your appointment. ° °2) Contact Your Local Health Department °Not all health departments have doctors that can see patients for sick visits, but many do, so it is worth a call to see if yours does. If you don't know where your local health department is, you can check in your phone book. The CDC also has a tool to help you locate your state's health department, and many state websites also have listings of all of their local health departments. ° °3) Find a Walk-in Clinic °If your illness is not likely to be very severe or complicated, you may want to try a walk in clinic. These are popping up all over the country in pharmacies, drugstores, and shopping centers. They're usually staffed by nurse practitioners or physician assistants that have been trained to treat common illnesses and complaints. They're usually fairly quick and inexpensive. However, if you have serious medical issues or chronic medical problems, these are probably not your best option. ° °No Primary Care Doctor: °- Call [facility] at  832-8000 - they can help you locate a primary care doctor that  accepts your  insurance, provides certain services, etc. °- Physician Referral Service- 1-800-533-3463 ° °Chronic Pain Problems: °Organization         Address  Phone   Notes  °[facility]  (336) 297-2271 Patients need to be referred by their primary care doctor.  ° °Medication Assistance: °Organization         Address  Phone   Notes  °[place] [facility] [address] °[place], [place] [address] (336) 641-8030 --Must be a resident of [place] °-- Must have NO insurance coverage whatsoever (no Medicaid/ Medicare, etc.) °-- The pt. MUST have a primary care doctor that directs their care regularly and follows them in the community °  °[facility]  (866) 331-1348   °[employer]  (888) 892-1162   ° °Agencies that provide inexpensive medical care: °Organization         Address  Phone   Notes  °[facility]  (336) 832-8035   °[facility]    (336) 832-7272   °[facility] [address] °[place], [place] [address] (336) 832-4777   °[facility] [address], °[place] (336) 271-4999   °[facility]    (336) 373-0678   °[facility]    (336) 272-1050   °[facility] ° [address], [place] Phone:  (336) 832-4444, Fax:  (336) 832-4440 Hours of Operation:  9 am -   6 pm, M-F.  Also accepts Medicaid/Medicare and self-pay.  °[facility] ° [address], [place] Phone: (336) 832-3150, Fax: (336) 832-3151. Hours of Operation:  8:30 am - 5:30 pm, M-F.  Also accepts Medicaid and self-pay.  °[facility] [place] [address], [place] Phone: (336) 878-6027   °[facility] [address], [place], [place] (336)723-1848, Ext. 123 Mondays & Thursdays: 7-9 AM.  First 15 patients are seen on a first come, first serve basis. °  ° °Medicaid-accepting [place] Providers: ° °Organization          Address  Phone   Notes  °[facility] [address], [place] (336) 641-2100 Also accepts self-pay patients.  °[facility] [address], [place] ° (336) 856-9996   °[facility] [address], [place] (336) 288-8857   °[facility] [address] [place] [address], [place] (336) 299-7000   °Veita Bland [address], [place]  ° (336) 373-1557 Only accepts [place] Access Medicaid patients after they have their name applied to their card.  ° °Self-Pay (no insurance) in [place]: ° °Organization         Address  Phone   Notes  °Sickle Cell Patients, [facility] [address], [place] (336) 832-1970   °[facility] [address], [place] (336) 832-4400   °[facility] [place] ° [address], [place] (336) 992-4800   °[facility]/Dr. Osei-Bonsu ° [address] [place] [address], [place] or [address], [place] (336) 841-8500 Phone number for both [place] and [place] locations is the same.  °[facility] [address], [place] (336) 299-0000   °[facility] [address] [place] [address], [place] or [address] (336) 852-7530 °(336) 878-2260   °[facility] [address], [place] (336) 350-1642, phone; (336) 294-5005, fax Sees patients 1st and 3rd Saturday of every month.  Must not qualify for public or private insurance (i.e. Medicaid, Medicare, [place] Health Choice, Veterans' Benefits) • Household income should be no more than 200% of the poverty level •The clinic cannot treat you if you are pregnant or think you are pregnant • Sexually transmitted diseases are not treated at the clinic.  ° ° °Dental Care: °Organization         Address  Phone  Notes  °[facility] [address],   (336) 641-6152 Accepts children up to age 21 who are enrolled in Medicaid or [place] Health Choice; pregnant women with a Medicaid card; and children who have applied for Medicaid or [place] Health Choice, but were declined, whose parents can pay a reduced fee at time of service.  °[facility] [place]  [address], [place] (336) 641-7733 Accepts children up to age 21 who are enrolled in Medicaid or [place] Health Choice; pregnant women with a Medicaid card; and children who have applied for Medicaid or  Health Choice, but were declined, whose parents can pay a reduced fee at time of service.  °[facility] ° [address],  (336) 641-4533 Patients are seen by appointment only. Walk-ins are not accepted. [facility] will see patients 18 years of age and   older. °Monday - Tuesday (8am-5pm) °Most Wednesdays (8:30-5pm) °$30 per visit, cash only  °[facility] ° [address], [place] (336) 641-4533 Patients are seen by appointment only. Walk-ins are not accepted. [facility] will see patients 18 years of age and older. °One Wednesday Evening (Monthly: Volunteer Based).  $30 per visit, cash only  °[facility]  (919) 537-3737 for adults; Children under age 4, call Graduate Pediatric Dentistry at (919) [phone]. Children aged 4-14, please call (919) [phone] to request a pediatric application. ° Dental services are provided in all areas of dental care including fillings, crowns and bridges, complete and partial dentures, implants, gum treatment, root canals, and extractions. Preventive care is also provided. Treatment is provided to both adults and children. °Patients are selected via a lottery and there is often a waiting list. °  °[facility] [address], °[place] ° (336) 763-8833 [email or web address] °  °[facility] [address], [place], [place]  (336)723-1848, Ext. 123 Second and Fourth Thursday of each month, opens at 6:30 AM; Clinic ends at 9 AM.  Patients are seen on a first-come first-served basis, and a limited number are seen during each clinic.  ° °[facility] ° [address], [place], [place] (336) 723-7904   Eligibility Requirements °You must have lived in [place], [place], or [place] counties for at least the last three months. °  You cannot be eligible for state or federal sponsored healthcare insurance, including Veterans Administration, Medicaid, or Medicare. °  You generally cannot be eligible for healthcare insurance through your employer.  °  How to apply: °Eligibility screenings are held every Tuesday and Wednesday afternoon from 1:00 pm until 4:00 pm. You do not need an appointment for the interview!  °[facility] [address], [place], [place] 336-631-2330   °[facility]  336-342-8273   °[facility]  336-703-3100   °[facility]  336-570-6415   ° °Behavioral Health Resources in the Community: °Intensive Outpatient Programs °Organization         Address  Phone  Notes  °[facility] [address], [place], [place] 336-878-6098   °[facility] [address], [place], [place] 336-832-9800   °[facility] [address], [place], [place] ° 336-882-2125   °[facility] [address],  °[place], [place] 1-800-853-5163 or 336-641-4981   °Substance Abuse Resources °Organization         Address  Phone  Notes  °[facility]  336-882-2125   °[facility]  336-784-9470   °[facility]  336-285-9073   °[facility]  336-845-3988   °Residential & Outpatient Substance Abuse Program  1-800-659-3381   °Psychological Services °Organization         Address  Phone  Notes  °[facility]  336- 832-9600   °[facility]  336- 378-7881    °[facility] [address], [place] 1-800-853-5163 or 336-641-4981   ° °Mobile Crisis Teams °Organization         Address  Phone  Notes  °[facility], Mobile Crisis Care Unit  1-877-626-1772   °Assertive °[facility] ° [address] [place], [place] 336-834-9664   °Sharon DeEsch [address] °[place] 336-554-5454   ° °Self-Help/Support Groups °Organization         Address    Phone             Notes  °[facility] - variety of support groups  336- 373-1402 Call for more information  °Narcotics Anonymous (NA), [facility] [address], °[place]  2 meetings at this location  ° °Residential Treatment Programs °Organization         Address  Phone  Notes  °[facility] [address],    °[place]  1-866-801-8205   °[facility] ° [address], [place], [place] 704-293-8524   °[facility] [address], [place] 336-845-3988 Admissions: 8am-3pm M-F  °[facility] [address]    °[place], [place] 336-841-1104   °[facility] [address], [place], [place] 336-379-7146   °[facility] [address]  °[place], [place] 336-285-9073   °[facility] - Intensive Outpatient [address], [place], [place] 336-852-3033   °[facility] [address]  °[place], [place] 1-877-615-2722 or 336-784-9470   °[facility] [address] [place], [place] 336-227-7417 Accepts Medicaid  °[facility] [address]  °[place] 1-800-659-3381 Substance Abuse/Addiction Treatment  ° °[place] Behavioral Health Resources °Organization         Address  Phone  Notes  °[facility]  (888) 581-9988   °Julie Brannon, PhD [address] [place], [place]   (336) 349-5553 or (336) 951-0000   °[facility]   [address] °[place], [place] (336) 349-4454   °[facility] [address], [place], [place] (336) 342-8316 Insurance/Medicaid/sponsorship through [facility]  °[facility] [address]                                    [place], [place] (336) 342-8316 Therapy/tele-psych/case  °[facility] [address]  ° [place],  (336) 349-2233    °Dr. Arfeen  (336) 349-4544   °[facility] [place]  [employer] [place] [facility] 1) [address], [place] °2) [address], [place] °3)  [address], [place] (336) 349-3220 °(336) 342-7768 ° °(336) 342-8140   °[place] [facility] (336) 342-1394 or (336) 342-3537 (After Hours)    ° ° ° °

## 2015-12-16 NOTE — ED Notes (Signed)
Pt complains of pain and itching in both ears and sensitivity to sound for the past 2 weeks. Pt also complains of rash to the top of his head and chest for the past 3 days. Pt denies using different soap/lotions/detergents recently.
